# Patient Record
Sex: Female | Born: 1990 | Race: White | Hispanic: No | Marital: Single | Smoking: Never smoker
Health system: Southern US, Community
[De-identification: ages and names within clinical notes are randomized; demographics above are authoritative.]

---

## 2013-04-21 ENCOUNTER — Encounter (HOSPITAL_COMMUNITY): Payer: Self-pay | Admitting: Emergency Medicine

## 2013-04-21 DIAGNOSIS — R112 Nausea with vomiting, unspecified: Secondary | ICD-10-CM | POA: Insufficient documentation

## 2013-04-21 DIAGNOSIS — R1013 Epigastric pain: Secondary | ICD-10-CM | POA: Insufficient documentation

## 2013-04-21 DIAGNOSIS — Z3202 Encounter for pregnancy test, result negative: Secondary | ICD-10-CM | POA: Insufficient documentation

## 2013-04-21 DIAGNOSIS — Z79899 Other long term (current) drug therapy: Secondary | ICD-10-CM | POA: Insufficient documentation

## 2013-04-21 LAB — CBC WITH DIFFERENTIAL/PLATELET
Basophils Absolute: 0 10*3/uL (ref 0.0–0.1)
Basophils Relative: 0 % (ref 0–1)
EOS ABS: 0 10*3/uL (ref 0.0–0.7)
Eosinophils Relative: 0 % (ref 0–5)
HCT: 38.2 % (ref 36.0–46.0)
HEMOGLOBIN: 13.5 g/dL (ref 12.0–15.0)
Lymphocytes Relative: 11 % — ABNORMAL LOW (ref 12–46)
Lymphs Abs: 1.5 10*3/uL (ref 0.7–4.0)
MCH: 29.4 pg (ref 26.0–34.0)
MCHC: 35.3 g/dL (ref 30.0–36.0)
MCV: 83.2 fL (ref 78.0–100.0)
MONO ABS: 0.9 10*3/uL (ref 0.1–1.0)
MONOS PCT: 7 % (ref 3–12)
NEUTROS PCT: 82 % — AB (ref 43–77)
Neutro Abs: 10.8 10*3/uL — ABNORMAL HIGH (ref 1.7–7.7)
Platelets: 251 10*3/uL (ref 150–400)
RBC: 4.59 MIL/uL (ref 3.87–5.11)
RDW: 12.6 % (ref 11.5–15.5)
WBC: 13.2 10*3/uL — ABNORMAL HIGH (ref 4.0–10.5)

## 2013-04-21 LAB — URINE MICROSCOPIC-ADD ON

## 2013-04-21 LAB — COMPREHENSIVE METABOLIC PANEL
ALT: 8 U/L (ref 0–35)
AST: 20 U/L (ref 0–37)
Albumin: 4.1 g/dL (ref 3.5–5.2)
Alkaline Phosphatase: 40 U/L (ref 39–117)
BUN: 11 mg/dL (ref 6–23)
CO2: 24 mEq/L (ref 19–32)
CREATININE: 0.65 mg/dL (ref 0.50–1.10)
Calcium: 9.3 mg/dL (ref 8.4–10.5)
Chloride: 102 mEq/L (ref 96–112)
GFR calc Af Amer: >90 mL/min (ref >90)
GFR calc non Af Amer: >90 mL/min (ref >90)
Glucose, Bld: 123 mg/dL — ABNORMAL HIGH (ref 70–99)
POTASSIUM: 4 meq/L (ref 3.7–5.3)
Sodium: 140 mEq/L (ref 137–147)
TOTAL PROTEIN: 7.2 g/dL (ref 6.0–8.3)
Total Bilirubin: 0.4 mg/dL (ref 0.3–1.2)

## 2013-04-21 LAB — URINALYSIS, ROUTINE W REFLEX MICROSCOPIC
Bilirubin Urine: NEGATIVE
Glucose, UA: NEGATIVE mg/dL
Ketones, ur: NEGATIVE mg/dL
LEUKOCYTES UA: NEGATIVE
Nitrite: NEGATIVE
PROTEIN: NEGATIVE mg/dL
Specific Gravity, Urine: 1.025 (ref 1.005–1.030)
UROBILINOGEN UA: 0.2 mg/dL (ref 0.0–1.0)
pH: 5 (ref 5.0–8.0)

## 2013-04-21 LAB — PREGNANCY, URINE: PREG TEST UR: NEGATIVE

## 2013-04-21 LAB — LIPASE, BLOOD: LIPASE: 28 U/L (ref 11–59)

## 2013-04-21 NOTE — ED Notes (Signed)
Lower abd pain since yesterday  With diarrhea n v.  lmp dec 20th

## 2013-04-22 ENCOUNTER — Encounter (HOSPITAL_COMMUNITY): Payer: Self-pay | Admitting: Emergency Medicine

## 2013-04-22 ENCOUNTER — Emergency Department (HOSPITAL_COMMUNITY)
Admission: EM | Admit: 2013-04-22 | Discharge: 2013-04-22 | Disposition: A | Discharge status: Home / Self Care | Payer: 59 | Attending: Emergency Medicine | Admitting: Emergency Medicine

## 2013-04-22 ENCOUNTER — Emergency Department (HOSPITAL_COMMUNITY): Payer: 59

## 2013-04-22 DIAGNOSIS — N946 Dysmenorrhea, unspecified: Secondary | ICD-10-CM

## 2013-04-22 DIAGNOSIS — R109 Unspecified abdominal pain: Secondary | ICD-10-CM

## 2013-04-22 DIAGNOSIS — Z79899 Other long term (current) drug therapy: Secondary | ICD-10-CM | POA: Insufficient documentation

## 2013-04-22 DIAGNOSIS — R112 Nausea with vomiting, unspecified: Secondary | ICD-10-CM

## 2013-04-22 MED ORDER — ONDANSETRON 4 MG PO TBDP
4.0000 mg | ORAL_TABLET | Freq: Once | ORAL | Status: AC
  Administered 2013-04-22: 4 mg via ORAL
  Filled 2013-04-22: qty 1

## 2013-04-22 MED ORDER — ONDANSETRON 4 MG PO TBDP: 4.0000 mg | ORAL_TABLET | Freq: Three times a day (TID) | ORAL | Status: AC | PRN

## 2013-04-22 MED ORDER — IOHEXOL 300 MG/ML  SOLN
80.0000 mL | Freq: Once | INTRAMUSCULAR | Status: AC | PRN
  Administered 2013-04-22: 80 mL via INTRAVENOUS

## 2013-04-22 MED ORDER — MORPHINE SULFATE 2 MG/ML IJ SOLN
2.0000 mg | Freq: Once | INTRAMUSCULAR | Status: AC
  Administered 2013-04-22: 2 mg via INTRAVENOUS
  Filled 2013-04-22: qty 1

## 2013-04-22 MED ORDER — ONDANSETRON HCL 4 MG/2ML IJ SOLN
4.0000 mg | Freq: Once | INTRAMUSCULAR | Status: AC
  Administered 2013-04-22: 4 mg via INTRAVENOUS
  Filled 2013-04-22: qty 2

## 2013-04-22 MED ORDER — FAMOTIDINE IN NACL 20-0.9 MG/50ML-% IV SOLN
20.0000 mg | Freq: Once | INTRAVENOUS | Status: AC
  Administered 2013-04-22: 20 mg via INTRAVENOUS
  Filled 2013-04-22: qty 50

## 2013-04-22 MED ORDER — PANTOPRAZOLE SODIUM 20 MG PO TBEC: 20.0000 mg | DELAYED_RELEASE_TABLET | Freq: Every day | ORAL | Status: AC

## 2013-04-22 MED ORDER — SODIUM CHLORIDE 0.9 % IV BOLUS (SEPSIS)
1000.0000 mL | Freq: Once | INTRAVENOUS | Status: AC
  Administered 2013-04-22: 1000 mL via INTRAVENOUS

## 2013-04-22 MED ORDER — IOHEXOL 300 MG/ML  SOLN
25.0000 mL | INTRAMUSCULAR | Status: AC
  Administered 2013-04-22: 25 mL via ORAL

## 2013-04-22 NOTE — Discharge Instructions (Signed)
Abdominal Pain, Adult Many things can cause abdominal pain. Usually, abdominal pain is not caused by a disease and will improve without treatment. It can often be observed and treated at home. Your health care provider will do a physical exam and possibly order blood tests and X-rays to help determine the seriousness of your pain. However, in many cases, more time must pass before a clear cause of the pain can be found. Before that point, your health care provider may not know if you need more testing or further treatment. HOME CARE INSTRUCTIONS  Monitor your abdominal pain for any changes. The following actions may help to alleviate any discomfort you are experiencing:  Only take over-the-counter or prescription medicines as directed by your health care provider.  Do not take laxatives unless directed to do so by your health care provider.  Try a clear liquid diet (broth, tea, or water) as directed by your health care provider. Slowly move to a bland diet as tolerated. SEEK MEDICAL CARE IF:  You have unexplained abdominal pain.  You have abdominal pain associated with nausea or diarrhea.  You have pain when you urinate or have a bowel movement.  You experience abdominal pain that wakes you in the night.  You have abdominal pain that is worsened or improved by eating food.  You have abdominal pain that is worsened with eating fatty foods. SEEK IMMEDIATE MEDICAL CARE IF:   Your pain does not go away within 2 hours.  You have a fever.  You keep throwing up (vomiting).  Your pain is felt only in portions of the abdomen, such as the right side or the left lower portion of the abdomen.  You pass bloody or black tarry stools. MAKE SURE YOU:  Understand these instructions.   Will watch your condition.   Will get help right away if you are not doing well or get worse.  Document Released: 12/29/2004 Document Revised: 01/09/2013 Document Reviewed: 11/28/2012 [facility] Patient  Information 2014 [employer], [place].   PLEASE RETURN TO THE ED IF YOU HAVE WORSENING SYMPTOMS, FEVER OR ANY URGENT HEALTH CONCERNS.    PLEASE FOLLOW UP AT THE [facility] IN APPROXIMATELY 24 HRS FOR A RE-EXAMINATION.

## 2013-04-22 NOTE — ED Notes (Signed)
PA at bedside.

## 2013-04-22 NOTE — ED Provider Notes (Signed)
CSN: [id number]     Arrival date & time 04/22/13  1349 History   First MD Initiated Contact with Patient 04/22/13 1536     Chief Complaint  Patient presents with  . Abdominal Pain   (Consider location/radiation/quality/duration/timing/severity/associated sxs/prior Treatment) HPI  Patient to the ER for re-evaluation for her abdominal pain. She was seen last night for the same, had blood work and a CT abd/pelv done and discharged this morning, asked to follow-up at a Clinic.  Her symptoms started three days ago with vomiting and diarrhea. She is also having pain in her right lower quadrant and epigastric region. She was seen by an EDP, her pain treated. Since being discharged she is no longer having severe RLQ abdominal pain, epigastric pain or vomiting. She started her menstrual cycle this normal and her flow is extremely heavy (which is baseline for her). Her abdominal discomfort is her normal menstrual cramping pain.  She returned to the ER today because she was concerned about her CT scan results, she felt that she needed a repeat since they couldn't see the appendix first time and since a PCP could not look at it again today. The way she is feeling now is much better.  History reviewed. No pertinent past medical history. History reviewed. No pertinent past surgical history. No family history on file. History  Substance Use Topics  . Smoking status: Never Smoker   . Smokeless tobacco: Not on file  . Alcohol Use: No   OB History   Grav Para Term Preterm Abortions TAB SAB Ect Mult Living                 Review of Systems The patient denies anorexia, fever, weight loss,, vision loss, decreased hearing, hoarseness, chest pain, syncope, dyspnea on exertion, peripheral edema, balance deficits, hemoptysis,  melena, hematochezia, severe indigestion/heartburn, hematuria, incontinence, genital sores, muscle weakness, suspicious skin lesions, transient blindness, difficulty walking, depression,  unusual weight change,  enlarged lymph nodes, angioedema, and breast masses.  Allergies  Review of patient's allergies indicates no known allergies.  Home Medications   Current Outpatient Rx  Name  Route  Sig  Dispense  Refill  . albuterol (PROVENTIL HFA;VENTOLIN HFA) 108 (90 BASE) MCG/ACT inhaler   Inhalation   Inhale 2 puffs into the lungs 2 (two) times daily as needed for wheezing or shortness of breath.         . Aspirin-Acetaminophen-Caffeine (GOODY HEADACHE PO)   Oral   Take 1 packet by mouth daily as needed (pain).         . Cholecalciferol (VITAMIN D3 ADULT GUMMIES PO)   Oral   Take 2 tablets by mouth daily.         . pantoprazole (PROTONIX) 20 MG tablet   Oral   Take 1 tablet (20 mg total) by mouth daily.   30 tablet   0   . ondansetron (ZOFRAN ODT) 4 MG disintegrating tablet   Oral   Take 1 tablet (4 mg total) by mouth every 8 (eight) hours as needed for nausea or vomiting.   20 tablet   0    BP 106/75  Pulse 89  Temp(Src) 97.8 F (36.6 C) (Oral)  Resp 18  Wt 110 lb (49.896 kg)  SpO2 99%  LMP 04/22/2013 Physical Exam  Nursing note and vitals reviewed. Constitutional: She appears well-developed and well-nourished. No distress.  HENT:  Head: Normocephalic and atraumatic.  Eyes: Pupils are equal, round, and reactive to light.  Neck: Normal range  of motion. Neck supple.  Cardiovascular: Normal rate and regular rhythm.   Pulmonary/Chest: Effort normal. No respiratory distress. She has no wheezes.  Abdominal: Soft. There is no tenderness. There is no rebound and no guarding.  Neurological: She is alert.  Skin: Skin is warm and dry.    ED Course  Procedures (including critical care time) Labs Review Labs Reviewed - No data to display Imaging Review Ct Abdomen Pelvis W Contrast  04/22/2013   CLINICAL DATA:  Lower abdominal pain, diarrhea, nausea and vomiting.  EXAM: CT ABDOMEN AND PELVIS WITH CONTRAST  TECHNIQUE: Multidetector CT imaging of the  abdomen and pelvis was performed using the standard protocol following bolus administration of intravenous contrast.  CONTRAST:  80mL OMNIPAQUE IOHEXOL 300 MG/ML  SOLN  COMPARISON:  None.  FINDINGS: The visualized lung bases are clear.  The liver and spleen are unremarkable in appearance. The gallbladder is within normal limits. The pancreas and adrenal glands are unremarkable.  The kidneys are unremarkable in appearance. There is no evidence of hydronephrosis. No renal or ureteral stones are seen. No perinephric stranding is appreciated.  No free fluid is identified. The small bowel is unremarkable in appearance. The stomach is within normal limits. No acute vascular abnormalities are seen.  The appendix is not definitely characterized; there is no evidence for appendicitis. The colon is unremarkable in appearance.  The bladder is mildly distended and grossly unremarkable. The uterus is within normal limits. The ovaries are relatively symmetric; no suspicious adnexal masses are seen. No inguinal lymphadenopathy is seen.  No acute osseous abnormalities are identified.  IMPRESSION: No acute abnormality seen within the abdomen or pelvis. The appendix is not definitely seen.   Electronically Signed   By: Roanna RaiderJeffery  Chang M.D.   On: 04/22/2013 03:47    EKG Interpretation   None       MDM   1. Dysmenorrhea    I discussed with patient that if the radiologist was unable to see her appendix then that is significant that it is not inflamed. An inflamed appendix is usually swollen and easier to see. She said she did not know this and was relieved. We had a long discussion of what to do next. The patient says that since she feels much better she prefers not to have any further work up.  I discussed ordering an I-stat, wet prep, gc, pelvic, possibly a uterine ultrasound. She declined and that she wants to see how she feels in one week. If she feels better she will return for re-evaluation. Since she feels better,  has a benign abdominal exam, I agreed that if she can tolerate a fluid challenge without vomiting, then I agree to her preference to watchful waiting.  Patient tolerated fluid challenge very well. Will Rx Zofran and have her return to ED as needed or follow-up at the clinic.  22 y.o.Sheila Valdez's evaluation in the Emergency Department is complete. It has been determined that no acute conditions requiring further emergency intervention are present at this time. The patient/guardian have been advised of the diagnosis and plan. We have discussed signs and symptoms that warrant return to the ED, such as changes or worsening in symptoms.  Vital signs are stable at discharge. Filed Vitals:   04/22/13 1405  BP: 106/75  Pulse: 89  Temp: 97.8 F (36.6 C)  Resp: 18    Patient/guardian has voiced understanding and agreed to follow-up with the PCP or specialist.     Dorthula Matasiffany G Michel Eskelson, PA-C 04/22/13 1703

## 2013-04-22 NOTE — Discharge Instructions (Signed)

## 2013-04-22 NOTE — ED Notes (Signed)
Patient transported to CT 

## 2013-04-22 NOTE — ED Notes (Signed)
Patient stated has no medical hx and dose not take any medication on a regular bases.

## 2013-04-22 NOTE — ED Provider Notes (Signed)
CSN: [id number]     Arrival date & time 04/21/13  2018 History   First MD Initiated Contact with Patient 04/22/13 0026     Chief Complaint  Patient presents with  . Abdominal Pain   (Consider location/radiation/quality/duration/timing/severity/associated sxs/prior Treatment) HPI This patient is a 23 yo generally healthy woman. She presents with abdominal pain for the past 12 hrs. Pain first present in the RLQ then seemed to migrate to the epigastrium. Patient says she has most severe abdominal pain, at this time, in the midline epigastric region. "It almost feels like gas pains".  She has been nauseated and has vomited nbnb emesis x 1. Pt has had 4 episodes of watery diarrhea. No fever.   Pain is currently 7/10, cramping, aching. It was more severe earlier today. Pain is worse with movements. No history of similar sx. No h/o abd surgeries. NO GU sx.   History reviewed. No pertinent past medical history. History reviewed. No pertinent past surgical history. No family history on file. History  Substance Use Topics  . Smoking status: Never Smoker   . Smokeless tobacco: Not on file  . Alcohol Use: No   OB History   Grav Para Term Preterm Abortions TAB SAB Ect Mult Living                 Review of Systems Ten point review of symptoms performed and is negative with the exception of symptoms noted above.   Allergies  Review of patient's allergies indicates no known allergies.  Home Medications   Current Outpatient Rx  Name  Route  Sig  Dispense  Refill  . albuterol (PROVENTIL HFA;VENTOLIN HFA) 108 (90 BASE) MCG/ACT inhaler   Inhalation   Inhale 2 puffs into the lungs 2 (two) times daily as needed for wheezing or shortness of breath.         . Aspirin-Acetaminophen-Caffeine (GOODY HEADACHE PO)   Oral   Take 1 packet by mouth daily as needed (pain).         . Cholecalciferol (VITAMIN D3 ADULT GUMMIES PO)   Oral   Take 2 tablets by mouth daily.          BP 103/71  Pulse  101  Temp(Src) 99.2 F (37.3 C)  Resp 20  Ht 5\' 4"  (1.626 m)  Wt 110 lb (49.896 kg)  BMI 18.87 kg/m2  SpO2 98%  LMP 03/23/2013 Physical Exam Gen: well developed and well nourished appearing Head: NCAT Eyes: PERL, EOMI Nose: no epistaixis or rhinorrhea Mouth/throat: mucosa is moist and pink Neck: supple, no stridor Lungs: CTA B, no wheezing, rhonchi or rales CV: RRR, no murmur, extremities appear well perfused.  Abd: soft, ttp over McBurney's point,  nondistended Back: no ttp, no cva ttp Skin: warm and dry Ext: normal to inspection, no dependent edema Neuro: CN ii-xii grossly intact, no focal deficits Psyche; normal affect,  calm and cooperative.   ED Course  Procedures (including critical care time) Labs Review Results for orders placed during the hospital encounter of 04/22/13 (from the past 24 hour(s))  CBC WITH DIFFERENTIAL     Status: Abnormal   Collection Time    04/21/13  8:25 PM      Result Value Range   WBC 13.2 (*) 4.0 - 10.5 K/uL   RBC 4.59  3.87 - 5.11 MIL/uL   Hemoglobin 13.5  12.0 - 15.0 g/dL   HCT 40.9  81.1 - 91.4 %   MCV 83.2  78.0 - 100.0 fL  MCH 29.4  26.0 - 34.0 pg   MCHC 35.3  30.0 - 36.0 g/dL   RDW 09.812.6  11.911.5 - 14.715.5 %   Platelets 251  150 - 400 K/uL   Neutrophils Relative % 82 (*) 43 - 77 %   Neutro Abs 10.8 (*) 1.7 - 7.7 K/uL   Lymphocytes Relative 11 (*) 12 - 46 %   Lymphs Abs 1.5  0.7 - 4.0 K/uL   Monocytes Relative 7  3 - 12 %   Monocytes Absolute 0.9  0.1 - 1.0 K/uL   Eosinophils Relative 0  0 - 5 %   Eosinophils Absolute 0.0  0.0 - 0.7 K/uL   Basophils Relative 0  0 - 1 %   Basophils Absolute 0.0  0.0 - 0.1 K/uL  COMPREHENSIVE METABOLIC PANEL     Status: Abnormal   Collection Time    04/21/13  8:25 PM      Result Value Range   Sodium 140  137 - 147 mEq/L   Potassium 4.0  3.7 - 5.3 mEq/L   Chloride 102  96 - 112 mEq/L   CO2 24  19 - 32 mEq/L   Glucose, Bld 123 (*) 70 - 99 mg/dL   BUN 11  6 - 23 mg/dL   Creatinine, Ser 8.290.65  0.50  - 1.10 mg/dL   Calcium 9.3  8.4 - 56.210.5 mg/dL   Total Protein 7.2  6.0 - 8.3 g/dL   Albumin 4.1  3.5 - 5.2 g/dL   AST 20  0 - 37 U/L   ALT 8  0 - 35 U/L   Alkaline Phosphatase 40  39 - 117 U/L   Total Bilirubin 0.4  0.3 - 1.2 mg/dL   GFR calc non Af Amer >90  >90 mL/min   GFR calc Af Amer >90  >90 mL/min  LIPASE, BLOOD     Status: None   Collection Time    04/21/13  8:25 PM      Result Value Range   Lipase 28  11 - 59 U/L  URINALYSIS, ROUTINE W REFLEX MICROSCOPIC     Status: Abnormal   Collection Time    04/21/13  8:57 PM      Result Value Range   Color, Urine YELLOW  YELLOW   APPearance CLEAR  CLEAR   Specific Gravity, Urine 1.025  1.005 - 1.030   pH 5.0  5.0 - 8.0   Glucose, UA NEGATIVE  NEGATIVE mg/dL   Hgb urine dipstick LARGE (*) NEGATIVE   Bilirubin Urine NEGATIVE  NEGATIVE   Ketones, ur NEGATIVE  NEGATIVE mg/dL   Protein, ur NEGATIVE  NEGATIVE mg/dL   Urobilinogen, UA 0.2  0.0 - 1.0 mg/dL   Nitrite NEGATIVE  NEGATIVE   Leukocytes, UA NEGATIVE  NEGATIVE  PREGNANCY, URINE     Status: None   Collection Time    04/21/13  8:57 PM      Result Value Range   Preg Test, Ur NEGATIVE  NEGATIVE  URINE MICROSCOPIC-ADD ON     Status: None   Collection Time    04/21/13  8:57 PM      Result Value Range   Squamous Epithelial / LPF RARE  RARE   WBC, UA 0-2  <3 WBC/hpf   RBC / HPF 3-6  <3 RBC/hpf   Bacteria, UA RARE  RARE   Urine-Other MUCOUS PRESENT      MDM  DDX: gastritis, appendicitis. PUD, GERD, pancreatitis, gallbladder disease, SBO, colitis, UTI, enteritis.  Clinical diagnosis of acute appendicitis. Will confirm with CT scan. Anticipate admission to [facility].   CT abd/pelvis normal with no signs of inflammation in the region of the appendix. However, appendix was not visualized, per radiologist. No cause identified for patient's sx. Patient re-examined. Her pain is now localizing to the midline epigastrium but, is mild and "feels like gas". The patient tolerated oral  contrast media without difficulty. Repeat abdominal exam is negative for any lower abdominal tenderness.   I have recommended a pelvic exam to rule out cervicitis/PID.  The patient declines and states that she just saw her gynecologist and has not had sexual intercourse for over 6 months.   I believe the patient is safe for discharge with plan for 24 hr re-examination at the [facility] and counsel to return for fever or worsening symtpoms. We will start on PPI for possible gastritis or PUD.     Brandt Loosen, MD 04/22/13 [id number]

## 2013-04-22 NOTE — ED Notes (Signed)
Pt was seen here yesterday and had CT scan that was indeterminable per patient.  Pt reports RLQ pain, [facility] until the end of February and was told to follow up in ed to R/O appendicitis.  LMP;  Started today and pain continues.

## 2013-04-24 NOTE — ED Provider Notes (Signed)
Medical screening examination/treatment/procedure(s) were performed by non-physician practitioner and as supervising physician I was immediately available for consultation/collaboration.  EKG Interpretation   None         Chester Romero, MD 04/24/13 1615 

## 2014-10-01 IMAGING — CT CT ABD-PELV W/ CM
2 of 4 series · 17 of 46 positions shown, 19 images · IV contrast (APPLIED)
Comparison: None.

CLINICAL DATA: Lower abdominal pain, diarrhea, nausea and vomiting.

EXAM:
CT ABDOMEN AND PELVIS WITH CONTRAST
TECHNIQUE: Multidetector CT imaging of the abdomen and pelvis was performed
using the standard protocol following bolus administration of
intravenous contrast.
CONTRAST:  80mL OMNIPAQUE IOHEXOL 300 MG/ML  SOLN

[Series 2: abd/ pelvis 5.0 i30f 1 · axial · 0.58mm/px · z∈[+895,+1255]mm · 14 of 80 slices shown, 16 images]
[im 4/80  soft-tissue]
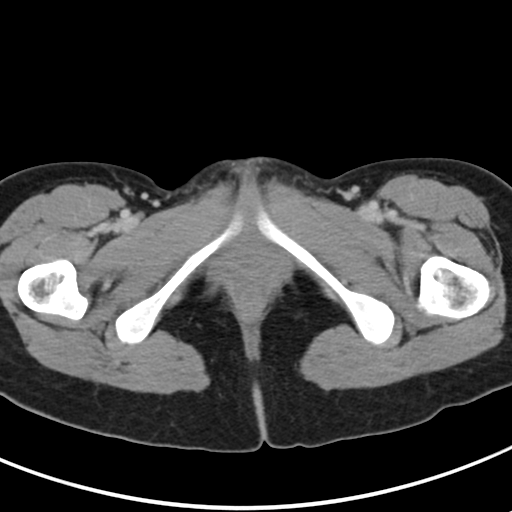
[im 4/80  bone]
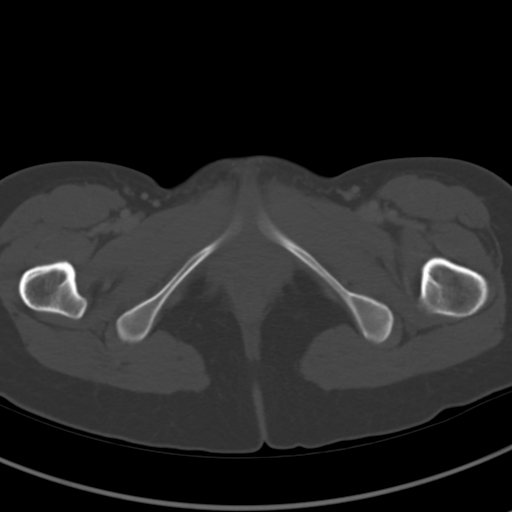
[im 10/80  soft-tissue]
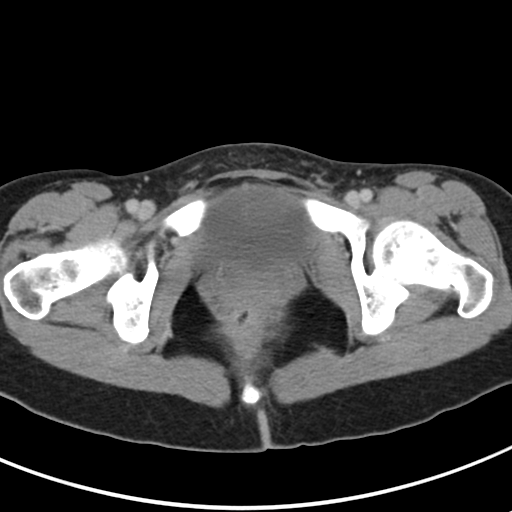
[im 17/80  soft-tissue]
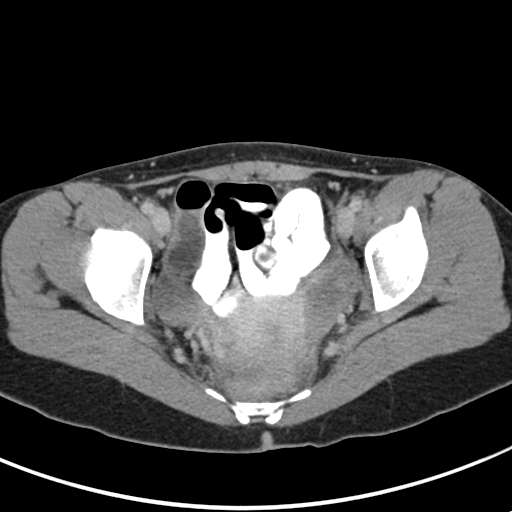
[im 20/80  soft-tissue]
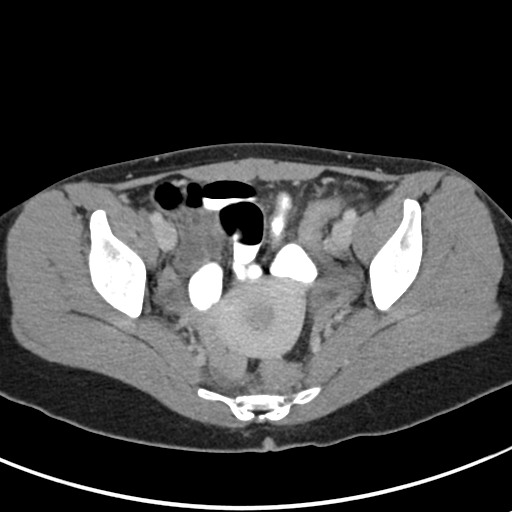
[im 27/80  soft-tissue]
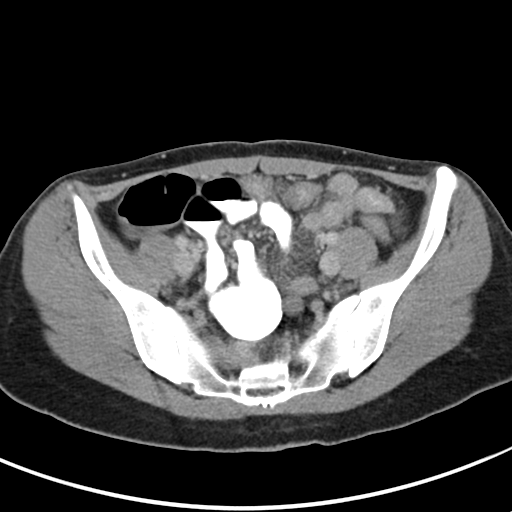
[im 33/80  soft-tissue]
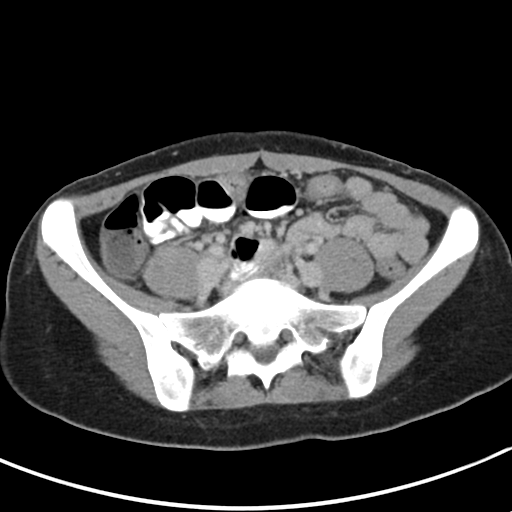
[im 37/80  soft-tissue]
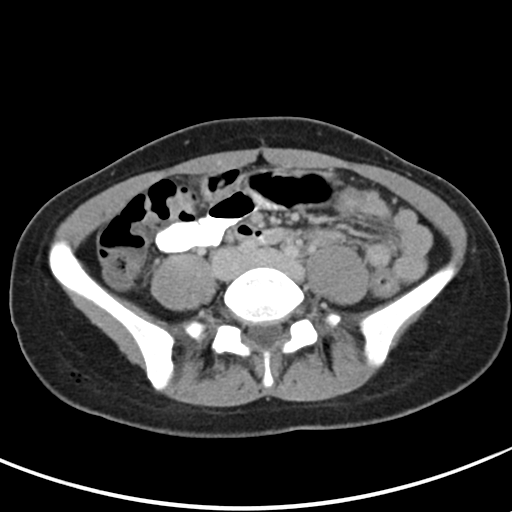
[im 43/80  soft-tissue]
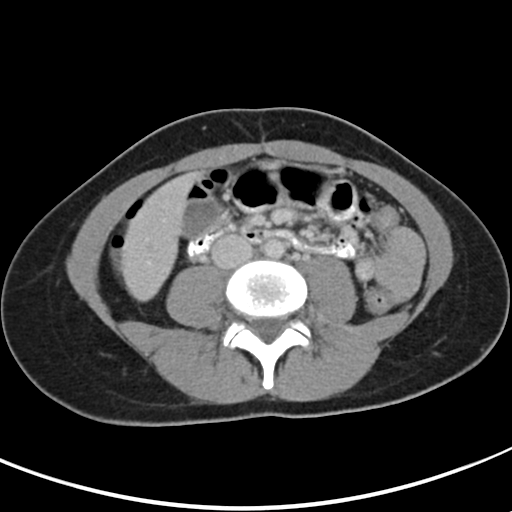
[im 47/80  soft-tissue]
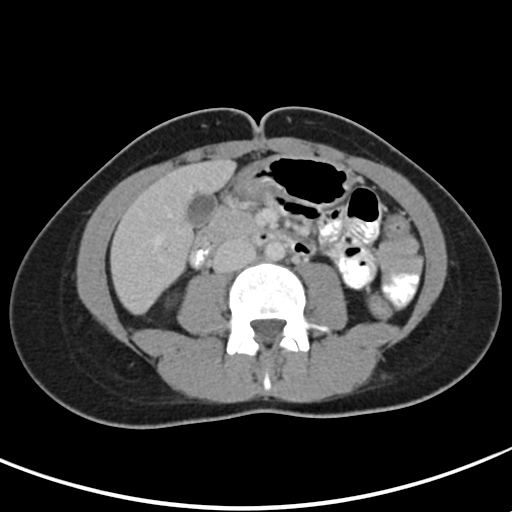
[im 47/80  bone]
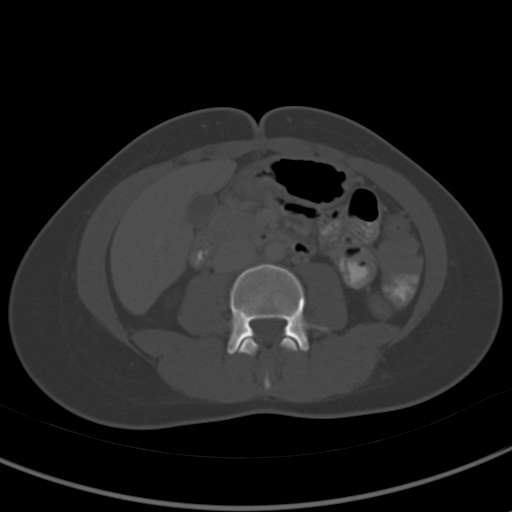
[im 53/80  soft-tissue]
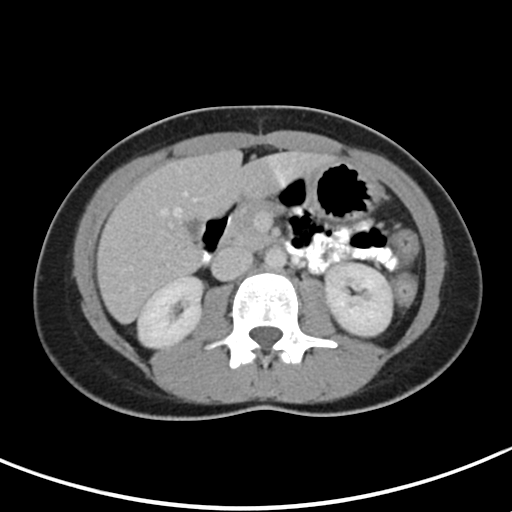
[im 60/80  soft-tissue]
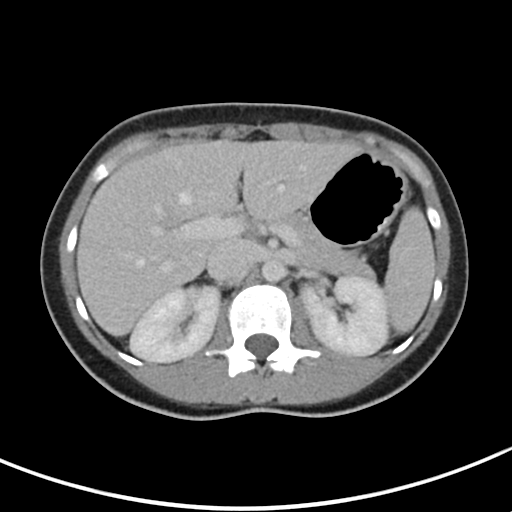
[im 63/80  soft-tissue]
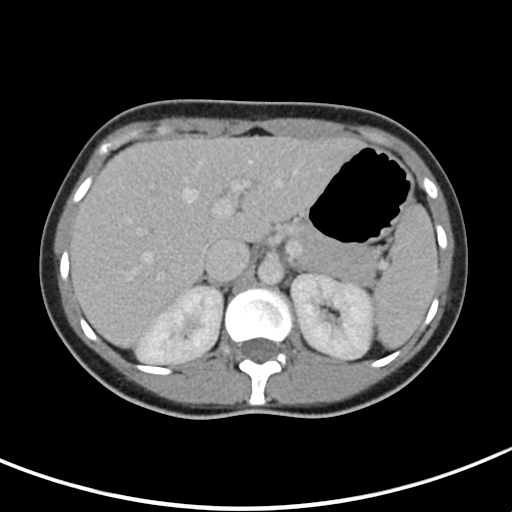
[im 70/80  soft-tissue]
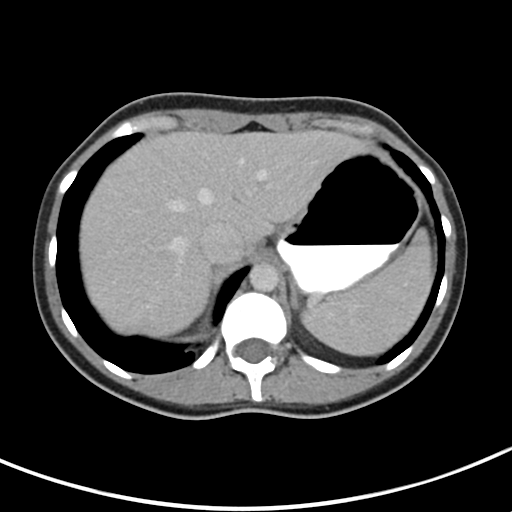
[im 76/80  soft-tissue]
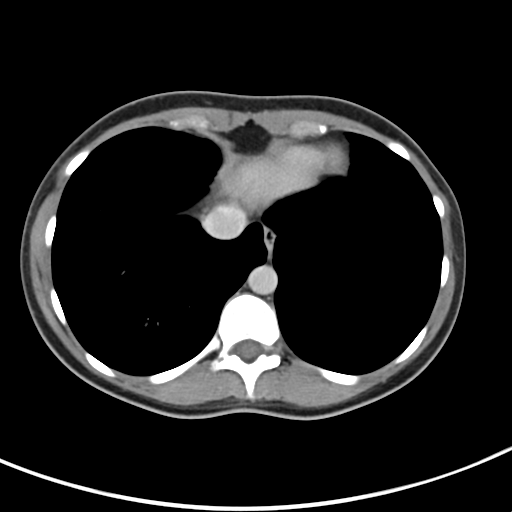

[Series 5: coronal soft tissue · coronal · 0.70mm/px · 3 of 62 slices shown]
[im 21/62  soft-tissue]
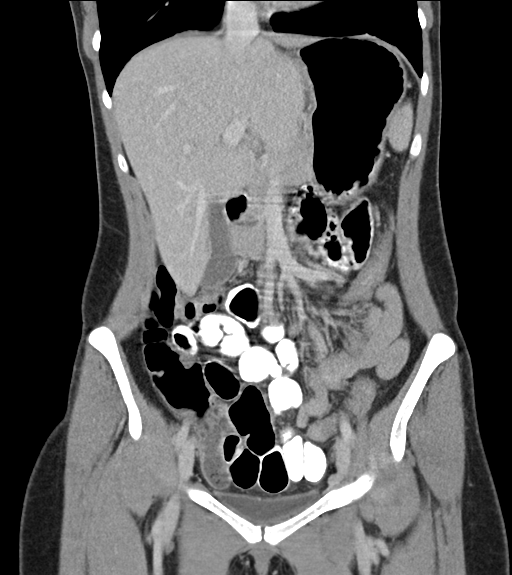
[im 28/62  soft-tissue]
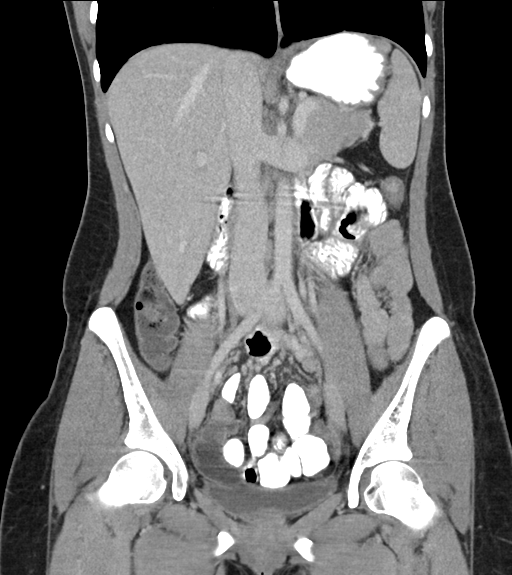
[im 34/62  soft-tissue]
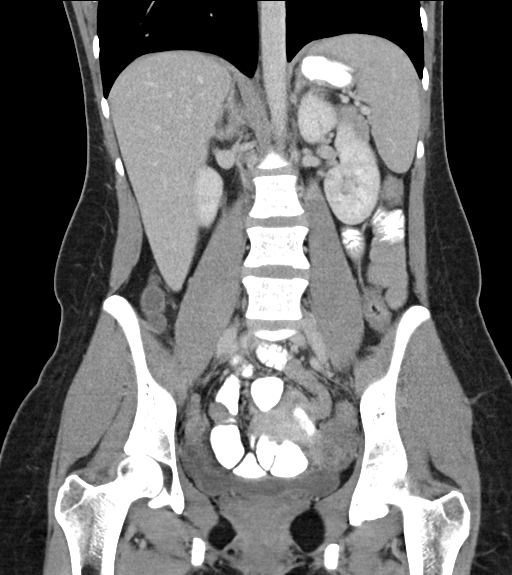

[17 of 46 positions shown; findings below may reference images not displayed]

FINDINGS: The visualized lung bases are clear.

The liver and spleen are unremarkable in appearance. The gallbladder
is within normal limits. The pancreas and adrenal glands are
unremarkable.

The kidneys are unremarkable in appearance. There is no evidence of
hydronephrosis. No renal or ureteral stones are seen. No perinephric
stranding is appreciated.

No free fluid is identified. The small bowel is unremarkable in
appearance. The stomach is within normal limits. No acute vascular
abnormalities are seen.

The appendix is not definitely characterized; there is no evidence
for appendicitis. The colon is unremarkable in appearance.

The bladder is mildly distended and grossly unremarkable. The uterus
is within normal limits. The ovaries are relatively symmetric; no
suspicious adnexal masses are seen. No inguinal lymphadenopathy is
seen.

No acute osseous abnormalities are identified.
IMPRESSION: No acute abnormality seen within the abdomen or pelvis. The appendix
is not definitely seen.
